# Patient Record
Sex: Female | Born: 1970 | Race: White | Hispanic: No | Marital: Single | Smoking: Never smoker
Health system: Southern US, Community
[De-identification: ages and names within clinical notes are randomized; demographics above are authoritative.]

---

## 2008-09-19 ENCOUNTER — Other Ambulatory Visit: Admission: RE | Admit: 2008-09-19 | Discharge: 2008-09-19 | Payer: Self-pay | Admitting: Family Medicine

## 2009-11-06 ENCOUNTER — Other Ambulatory Visit: Admission: RE | Admit: 2009-11-06 | Discharge: 2009-11-06 | Payer: Self-pay | Admitting: Family Medicine

## 2010-11-11 ENCOUNTER — Other Ambulatory Visit: Payer: Self-pay | Admitting: Family Medicine

## 2010-11-11 ENCOUNTER — Other Ambulatory Visit
Admission: RE | Admit: 2010-11-11 | Discharge: 2010-11-11 | Payer: Self-pay | Source: Home / Self Care | Admitting: Family Medicine

## 2012-11-15 ENCOUNTER — Other Ambulatory Visit: Payer: Self-pay | Admitting: Family Medicine

## 2012-11-15 ENCOUNTER — Other Ambulatory Visit (HOSPITAL_COMMUNITY)
Admission: RE | Admit: 2012-11-15 | Discharge: 2012-11-15 | Disposition: A | Discharge status: Home / Self Care | Payer: BC Managed Care – PPO | Source: Ambulatory Visit | Attending: Family Medicine | Admitting: Family Medicine

## 2012-11-15 DIAGNOSIS — Z1231 Encounter for screening mammogram for malignant neoplasm of breast: Secondary | ICD-10-CM

## 2012-11-15 DIAGNOSIS — Z124 Encounter for screening for malignant neoplasm of cervix: Secondary | ICD-10-CM | POA: Insufficient documentation

## 2012-12-20 ENCOUNTER — Ambulatory Visit
Admission: RE | Admit: 2012-12-20 | Discharge: 2012-12-20 | Disposition: A | Discharge status: Home / Self Care | Payer: BC Managed Care – PPO | Source: Ambulatory Visit | Attending: Family Medicine | Admitting: Family Medicine

## 2012-12-20 DIAGNOSIS — Z1231 Encounter for screening mammogram for malignant neoplasm of breast: Secondary | ICD-10-CM

## 2013-12-01 ENCOUNTER — Ambulatory Visit
Admission: RE | Admit: 2013-12-01 | Discharge: 2013-12-01 | Disposition: A | Discharge status: Home / Self Care | Payer: BC Managed Care – PPO | Source: Ambulatory Visit | Attending: Family Medicine | Admitting: Family Medicine

## 2013-12-01 ENCOUNTER — Other Ambulatory Visit: Payer: Self-pay | Admitting: Family Medicine

## 2013-12-01 DIAGNOSIS — M25532 Pain in left wrist: Secondary | ICD-10-CM

## 2014-02-13 ENCOUNTER — Other Ambulatory Visit: Payer: Self-pay | Admitting: Orthopedic Surgery

## 2014-02-13 DIAGNOSIS — M25532 Pain in left wrist: Secondary | ICD-10-CM

## 2014-02-23 ENCOUNTER — Ambulatory Visit
Admission: RE | Admit: 2014-02-23 | Discharge: 2014-02-23 | Disposition: A | Discharge status: Home / Self Care | Payer: BC Managed Care – PPO | Source: Ambulatory Visit | Attending: Orthopedic Surgery | Admitting: Orthopedic Surgery

## 2014-02-23 DIAGNOSIS — M25532 Pain in left wrist: Secondary | ICD-10-CM

## 2014-02-23 MED ORDER — IOHEXOL 180 MG/ML  SOLN
3.0000 mL | Freq: Once | INTRAMUSCULAR | Status: AC | PRN
  Administered 2014-02-23: 3 mL via INTRA_ARTICULAR

## 2016-06-25 ENCOUNTER — Other Ambulatory Visit: Payer: Self-pay | Admitting: Family Medicine

## 2016-06-25 ENCOUNTER — Other Ambulatory Visit (HOSPITAL_COMMUNITY)
Admission: RE | Admit: 2016-06-25 | Discharge: 2016-06-25 | Disposition: A | Discharge status: Home / Self Care | Payer: BC Managed Care – PPO | Source: Ambulatory Visit | Attending: Family Medicine | Admitting: Family Medicine

## 2016-06-25 DIAGNOSIS — Z01419 Encounter for gynecological examination (general) (routine) without abnormal findings: Secondary | ICD-10-CM | POA: Diagnosis not present

## 2016-06-27 LAB — CYTOLOGY - PAP

## 2018-02-08 ENCOUNTER — Other Ambulatory Visit: Payer: Self-pay | Admitting: Family Medicine

## 2018-02-08 DIAGNOSIS — Z1231 Encounter for screening mammogram for malignant neoplasm of breast: Secondary | ICD-10-CM

## 2018-02-16 ENCOUNTER — Ambulatory Visit
Admission: RE | Admit: 2018-02-16 | Discharge: 2018-02-16 | Disposition: A | Discharge status: Home / Self Care | Payer: BC Managed Care – PPO | Source: Ambulatory Visit | Attending: Family Medicine | Admitting: Family Medicine

## 2018-02-16 DIAGNOSIS — Z1231 Encounter for screening mammogram for malignant neoplasm of breast: Secondary | ICD-10-CM

## 2018-07-30 ENCOUNTER — Ambulatory Visit: Payer: BC Managed Care – PPO | Admitting: Psychiatry

## 2018-07-30 DIAGNOSIS — F4323 Adjustment disorder with mixed anxiety and depressed mood: Secondary | ICD-10-CM

## 2018-07-30 NOTE — Progress Notes (Signed)
      [facility] Counselor/Therapist Progress Note   Patient ID: Tammy [name], MRN: [id number]  Date: 07/30/2018  Timespent: 45 minutes  Treatment Type: Individual  Subjective: Client comes in today stressed due to her work environment at the [employer].  She realizes that people at her work have very similar behaviors to what she grew up with as a child.  She realizes that is why she is as reactive as she is.  The client has recently acquired a grant from [employer] [place] for a humanities corridor in [place].  This grant gives faculty members research money.  She continues to get emails from faculty who are upset because the IRS has taken a chunk of the research money for taxes.  She also has realized how the higher ups in the [place] are missed matched in their emotions and what they actually mean.  She finds this is very disingenuous but helpful now that she sees it.  Discussed that she Tammy [name] want to read Tammy [name] new book, "talking with strangers".  The client sees that she rushes from activity to activity.  Discussed trying to be more intentional about which activities she would take on and those that she would not.  Interventions:Assertiveness Training, Psychosocial Skills: Boundaries, Supportive and Other: EMDR  Mental Status Exam:   Appearance:   Well Groomed     Behavior:  Appropriate  Motor:  Normal  Speech/Language:   Clear and Coherent  Affect:  Appropriate  Mood:  constricted and sad  Thought process:  Coherent  Thought content:    Logical  Perceptual disturbances:    Normal  Orientation:  Full (Time, Place, and Person)  Attention:  Good  Concentration:  good  Memory:  Immediate  Fund of knowledge:   Good  Insight:    Good  Judgment:   Good  Impulse Control:  good    Reported Symptoms: Sad, anxious, frustrated  Risk Assessment: Danger to Self:  No Self-injurious Behavior: No Danger to Others: No Duty to Warn:no Physical Aggression /  Violence:No  Access to Firearms a concern: No  Gang Involvement:No   Diagnosis:   ICD-10-CM   1. Adjustment disorder with mixed anxiety and depressed mood F43.23      Plan: Read, Talking with Strangers, boundaries, assertiveness  [place], [place]

## 2018-09-15 ENCOUNTER — Ambulatory Visit: Payer: BC Managed Care – PPO | Admitting: Psychiatry

## 2018-11-05 ENCOUNTER — Ambulatory Visit: Payer: BC Managed Care – PPO | Admitting: Psychiatry

## 2018-11-05 ENCOUNTER — Encounter: Payer: Self-pay | Admitting: Psychiatry

## 2018-11-05 DIAGNOSIS — F4323 Adjustment disorder with mixed anxiety and depressed mood: Secondary | ICD-10-CM
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
## 2018-11-12 ENCOUNTER — Encounter: Payer: Self-pay | Admitting: Psychiatry

## 2018-11-12 ENCOUNTER — Ambulatory Visit: Payer: BC Managed Care – PPO | Admitting: Psychiatry

## 2018-11-12 DIAGNOSIS — F4323 Adjustment disorder with mixed anxiety and depressed mood: Secondary | ICD-10-CM | POA: Diagnosis not present

## 2018-11-12 NOTE — Progress Notes (Signed)
      [facility] Counselor/Therapist Progress Note  Patient ID: Tammy [name], MRN: [id number],    Date: 11/12/2018  Time Spent: 57 minutes   Treatment Type: Individual Therapy  Reported Symptoms: Anxious Mood  Mental Status Exam:  Appearance:   Well Groomed     Behavior:  Appropriate  Motor:  Normal  Speech/Language:   Clear and Coherent  Affect:  Appropriate  Mood:  anxious  Thought process:  normal  Thought content:    WNL  Sensory/Perceptual disturbances:    WNL  Orientation:  oriented to person, place, time/date and situation  Attention:  Good  Concentration:  Good  Memory:  WNL  Fund of knowledge:   Good  Insight:    Good  Judgment:   Good  Impulse Control:  Good   Risk Assessment: Danger to Self:  No Self-injurious Behavior: No Danger to Others: No Duty to Warn:no Physical Aggression / Violence:No  Access to Firearms a concern: No  Gang Involvement:No   Subjective: The client has gained good insight into boundaries with her for friends since last session.  "I am more observing and less reactionary."  Client has found that she has been able to set boundaries more naturally with friends.  This is been a huge boon for the client. She notes that she has been able to look at her department differently more in the big picture versus in a granular way.  This is been a positive mood for the client.  She also finds that she has been interacting with her family do differently.  She realizes that her mother has set up a codependent dysfunctional system in the family.  2 of her siblings are very self absorbed and not "enlightened."  She has 1 brother who seems to be decompensating in his social life.  She sees that he will not listen to her direction.  She has decided that she will listen to some [employer] on codependence and then verbally discussed some of that with her mom.  She is hopeful that if she can give her the right information that it will have a  trickle down affect to the rest of the family.  Interventions: Assertiveness/Communication, Solution-Oriented/Positive Psychology and Insight-Oriented  Diagnosis:   ICD-10-CM   1. Adjustment disorder with mixed anxiety and depressed mood F43.23     Plan: YouTube videos on codependence, conversations with mother, boundaries, assertiveness.  Gelene Mink Natesha Hassey, [place]

## 2018-11-17 ENCOUNTER — Ambulatory Visit: Payer: BC Managed Care – PPO | Admitting: Psychiatry

## 2018-12-01 ENCOUNTER — Ambulatory Visit: Payer: BC Managed Care – PPO | Admitting: Psychiatry

## 2018-12-01 ENCOUNTER — Encounter: Payer: Self-pay | Admitting: Psychiatry

## 2018-12-01 DIAGNOSIS — F4323 Adjustment disorder with mixed anxiety and depressed mood: Secondary | ICD-10-CM

## 2018-12-01 NOTE — Progress Notes (Signed)
      [facility] Counselor/Therapist Progress Note  Patient ID: Tammy [name], MRN: [id number],    Date: 12/01/2018  Time Spent: 57 minutes   Treatment Type: Individual Therapy  Reported Symptoms: Depressed mood and Anxious Mood  Mental Status Exam:  Appearance:   Well Groomed     Behavior:  Appropriate  Motor:  Normal  Speech/Language:   Clear and Coherent  Affect:  Appropriate  Mood:  anxious and sad  Thought process:  normal  Thought content:    WNL  Sensory/Perceptual disturbances:    WNL  Orientation:  oriented to person, place, time/date and situation  Attention:  Good  Concentration:  Good  Memory:  WNL  Fund of knowledge:   Good  Insight:    Good  Judgment:   Good  Impulse Control:  Good   Risk Assessment: Danger to Self:  No Self-injurious Behavior: No Danger to Others: No Duty to Warn:no Physical Aggression / Violence:No  Access to Firearms a concern: No  Gang Involvement:No   Subjective: The client did listen to some [employer] Manson Passey on YouTube but decided not to discuss any of this with her family.  She did not feel like it would be a productive conversation.  The client has noticed that she has more separation from other people and is able to accept their dysfunctional behavior without taking it on herself. The client was more upset with her family at the lack of reciprocity that she feels from them.  Her siblings seem to be fairly self absorbed.  All of them are married and since she is not they expect her to be at their back and call when she is in [place].  The client finds this very troubling.  I used eye movement with the client to help decrease her level of disturbance from a subjective units of distress of 5+ to less than 2 at the end of the session.  She continues to set boundaries and be assertive and clear in her communication.  She is also been better at not engaging in codependent behavior such as over responsibility.  She will actively work at cultivating  new relationships that are more reciprocal than her current ones.  Interventions: Assertiveness/Communication, Solution-Oriented/Positive Psychology, Eye Movement Desensitization and Reprocessing (EMDR) and Insight-Oriented  Diagnosis:   ICD-10-CM   1. Adjustment disorder with mixed anxiety and depressed mood F43.23     Plan: Cultivate new relationships, assertiveness, boundaries.  Tammy [name], [place]

## 2018-12-15 ENCOUNTER — Ambulatory Visit: Payer: BC Managed Care – PPO | Admitting: Psychiatry

## 2018-12-15 ENCOUNTER — Encounter: Payer: Self-pay | Admitting: Psychiatry

## 2018-12-15 DIAGNOSIS — F4323 Adjustment disorder with mixed anxiety and depressed mood: Secondary | ICD-10-CM | POA: Diagnosis not present
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
## 2018-12-29 ENCOUNTER — Ambulatory Visit: Payer: BC Managed Care – PPO | Admitting: Psychiatry

## 2018-12-29 ENCOUNTER — Encounter: Payer: Self-pay | Admitting: Psychiatry

## 2018-12-29 DIAGNOSIS — F4323 Adjustment disorder with mixed anxiety and depressed mood: Secondary | ICD-10-CM | POA: Diagnosis not present

## 2018-12-29 NOTE — Progress Notes (Signed)
      [facility] Counselor/Therapist Progress Note  Patient ID: Tammy [name], MRN: [id number],    Date: 12/29/2018  Time Spent: 57 minutes   Treatment Type: Individual Therapy  Reported Symptoms: anxious, sad.  Mental Status Exam:  Appearance:   Well Groomed     Behavior:  Appropriate  Motor:  Normal  Speech/Language:   Clear and Coherent  Affect:  Full Range  Mood:  anxious and sad  Thought process:  normal  Thought content:    WNL  Sensory/Perceptual disturbances:    WNL  Orientation:  oriented to person, place, time/date and situation  Attention:  Good  Concentration:  Good  Memory:  WNL  Fund of knowledge:   Good  Insight:    Good  Judgment:   Good  Impulse Control:  Good   Risk Assessment: Danger to Self:  No Self-injurious Behavior: No Danger to Others: No Duty to Warn:no Physical Aggression / Violence:No  Access to Firearms a concern: No  Gang Involvement:No   Subjective: Client states that she was granted [place] citizenship yesterday.  Some of her family were very supportive others not so much.  Her friends did come in celebrated with her.  She was sad when she told her younger sister who seemed that she could not be bothered. As the client discussed this she realized that she was redefining what a good family is, what a good sister is, and what a good sister relationship should be.  She notes that something has changed in her based on some interaction with her older brother.  Her older brother would talk with her on the phone screaming at the top of her lungs yelling at her not for anything that she is done before what is going on in his life.  Her mother on the other hand is always worried about what will people think.  The part that has changed for her as she has been able to develop more space between herself and her family in a healthy way.  She has also come to a better acceptance that her "family is who they are".  She has decreased her expectations of  people in her department at the [employer].  This has led to more peace for the client and the dizziness of her life.  She is cultivating more relationships that involve genuine reciprocity.  She saw that happening when she was celebrating her day of citizenship.  Local friends took the time off of work to be with her, travel to [place] and celebrate her citizenship. Client is developing a much clearer boundary with where she hands and where others began especially with her family of origin.  These are huge steps forward for the client.  Interventions: Assertiveness/Communication, Solution-Oriented/Positive Psychology, Eye Movement Desensitization and Reprocessing (EMDR) and Insight-Oriented  Diagnosis:   ICD-10-CM   1. Adjustment disorder with mixed anxiety and depressed mood F43.23     Plan: Boundaries, assertiveness, self-care, positive self talk, exercise.  This record has been created using [employer].  Chart creation errors have been sought, but Tammy [name] not always have been located and corrected. Such creation errors do not reflect on the standard of medical care.   Tammy [name], [place]

## 2019-01-12 ENCOUNTER — Encounter: Payer: Self-pay | Admitting: Psychiatry

## 2019-01-12 ENCOUNTER — Other Ambulatory Visit: Payer: Self-pay

## 2019-01-12 ENCOUNTER — Ambulatory Visit: Payer: BC Managed Care – PPO | Admitting: Psychiatry

## 2019-01-12 DIAGNOSIS — F4323 Adjustment disorder with mixed anxiety and depressed mood: Secondary | ICD-10-CM

## 2019-01-12 NOTE — Progress Notes (Signed)
      [facility] Counselor/Therapist Progress Note  Patient ID: Tammy [name], MRN: [id number],    Date: 01/12/2019  Time Spent: 56 minutes   Treatment Type: Individual Therapy  Reported Symptoms: anxiety, frustration  Mental Status Exam:  Appearance:   Well Groomed     Behavior:  Appropriate  Motor:  Normal  Speech/Language:   Clear and Coherent  Affect:  Appropriate  Mood:  anxious  Thought process:  normal  Thought content:    WNL  Sensory/Perceptual disturbances:    WNL  Orientation:  oriented to person, place, time/date and situation  Attention:  Good  Concentration:  Good  Memory:  WNL  Fund of knowledge:   Good  Insight:    Good  Judgment:   Good  Impulse Control:  Good   Risk Assessment: Danger to Self:  No Self-injurious Behavior: No Danger to Others: No Duty to Warn:no Physical Aggression / Violence:No  Access to Firearms a concern: No  Gang Involvement:No   Subjective: "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  She had also completed some major books although her [place] volume has yet to be completed.  She had approached the chair of her department asking her if she had her support to go for full professor.  This is a relationship that the client has found particularly difficult.  The interactions are full of passive aggressive behavior, thinking errors and overall difficulty.  Her chair would not give her a direct answer in the 3 emails she sent.  She was able to be very directed to the point and person and was finally able to get confirmation in writing that the lead tenured professor and the chair are both on board.  The client dialogue at length about her insight with her chair and how her behavior relates to some of her family dynamics.  The client realized that she is always let her locus of control the outside of herself  which then resulted in others defining her.  She has made great progress in being able to excepting distance herself from others behavior and not having an impact on who she thinks she gets.  The client admits that it has all been very arduous but worthwhile at the same time.  She will continue to be assertive and set boundaries making sure that she addresses her negative cognitions as they come up.  Interventions: Assertiveness/Communication, Solution-Oriented/Positive Psychology, Eye Movement Desensitization and Reprocessing (EMDR) and Insight-Oriented  Diagnosis:   ICD-10-CM   1. Adjustment disorder with mixed anxiety and depressed mood F43.23     Plan: Boundaries, assertiveness, self-care, positive self talk, exercise.  This record has been created using [employer].  Chart creation errors have been sought, but [name] not always have been located and corrected. Such creation errors do not reflect on the standard of medical care.   Khallid Pasillas, [place]

## 2019-01-28 ENCOUNTER — Other Ambulatory Visit: Payer: Self-pay

## 2019-01-28 ENCOUNTER — Ambulatory Visit (INDEPENDENT_AMBULATORY_CARE_PROVIDER_SITE_OTHER): Payer: BC Managed Care – PPO | Admitting: Psychiatry

## 2019-01-28 ENCOUNTER — Encounter: Payer: Self-pay | Admitting: Psychiatry

## 2019-01-28 DIAGNOSIS — F4323 Adjustment disorder with mixed anxiety and depressed mood: Secondary | ICD-10-CM
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
## 2019-02-11 ENCOUNTER — Other Ambulatory Visit: Payer: Self-pay

## 2019-02-11 ENCOUNTER — Encounter: Payer: Self-pay | Admitting: Psychiatry

## 2019-02-11 ENCOUNTER — Ambulatory Visit (INDEPENDENT_AMBULATORY_CARE_PROVIDER_SITE_OTHER): Payer: BC Managed Care – PPO | Admitting: Psychiatry

## 2019-02-11 DIAGNOSIS — F4323 Adjustment disorder with mixed anxiety and depressed mood: Secondary | ICD-10-CM

## 2019-02-11 NOTE — Progress Notes (Signed)
[facility] Counselor/Therapist Progress Note  Patient ID: Tammy [name], MRN: [id number],    Date: 02/11/2019  Time Spent: 58 minutes   Treatment Type: Individual Therapy  Reported Symptoms: frustration, sadness, anxiety.  Mental Status Exam:  Appearance:   Casual     Behavior:  Appropriate  Motor:  Normal  Speech/Language:   Clear and Coherent  Affect:  Appropriate  Mood:  anxious and sad  Thought process:  normal  Thought content:    WNL  Sensory/Perceptual disturbances:    WNL  Orientation:  oriented to person, place, time/date and situation  Attention:  Good  Concentration:  Good  Memory:  WNL  Fund of knowledge:   Good  Insight:    Good  Judgment:   Good  Impulse Control:  Good   Risk Assessment: Danger to Self:  No Self-injurious Behavior: No Danger to Others: No Duty to Warn:no Physical Aggression / Violence:No  Access to Firearms a concern: No  Gang Involvement:No   Subjective: I connected with patient by a video enabled telemedicine application or telephone, with their informed consent, and verified patient privacy and that I am speaking with the correct person using two identifiers.  I was located at [facility] psychiatric group and patient at home. The client discussed the event with other researchers for the [employer] that they got for humanities corridor in [place].  They had to do the whole event via WebEx which was difficult but the client was able to pull it off.  Apparently there is a Teacher, adult education in the [place] office.  The client feels that this woman is, "prickly".  She found out that the main fundraiser for [employer] also dislikes her intensely.  Client realizes that there is only so much she can do and she cannot obsess over whether things go correctly or not.  "I am not the person in charge."  This is a big change for the client since her tendency is to be overly responsible for everything. The client has learned that she has to be much  more flexible since the sheltering place order.  She was trying to fit herself into a certain schedule that "she thinks everybody else does."  She realizes this is not how she operates and has moved towards trying to be more flexible with herself.  Her need for social interaction is much greater than most.  She has been able to accomplish that through video chats and telephone calls.  She does which she can with her work and she finds herself not worrying about it as much. 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  She is very frustrated with her chair because she has not given her the dates of what is necessary to accomplish by when.  Her chair states that she is, "too needy."  In actuality the client has not been given the information that she needs.  So yes, she is "needy" but it is appropriate because she is not given what she is needed.  This has been good for the client to differentiate this is well.  She has been more skillful in her assertiveness and setting boundaries.  Interventions: Assertiveness/Communication, Solution-Oriented/Positive Psychology and Insight-Oriented  Diagnosis:   ICD-10-CM   1. Adjustment disorder with mixed anxiety and depressed mood F43.23     Plan: Assertiveness, boundaries, social network, exercise.  This record has been created using [employer].  Chart creation errors have been sought, but Tammy [name] not  always have been located and corrected. Such creation errors do not reflect on the standard of medical care.  Tammy [name], [facility]

## 2019-02-24 ENCOUNTER — Ambulatory Visit: Payer: BC Managed Care – PPO | Admitting: Psychiatry

## 2019-02-25 ENCOUNTER — Other Ambulatory Visit: Payer: Self-pay

## 2019-02-25 ENCOUNTER — Encounter: Payer: Self-pay | Admitting: Psychiatry

## 2019-02-25 ENCOUNTER — Ambulatory Visit (INDEPENDENT_AMBULATORY_CARE_PROVIDER_SITE_OTHER): Payer: BC Managed Care – PPO | Admitting: Psychiatry

## 2019-02-25 DIAGNOSIS — F4323 Adjustment disorder with mixed anxiety and depressed mood: Secondary | ICD-10-CM

## 2019-02-25 NOTE — Progress Notes (Signed)
[facility] Counselor/Therapist Progress Note  Patient ID: Tammy [name], MRN: [id number],    Date: 02/25/2019  Time Spent: 58 minutes   Treatment Type: Individual Therapy  Reported Symptoms: sadness, frustration, irritability, anxiety  Mental Status Exam:  Appearance:   Casual and Well Groomed     Behavior:  Appropriate  Motor:  Normal  Speech/Language:   Clear and Coherent  Affect:  Appropriate  Mood:  anxious, irritable and sad  Thought process:  normal  Thought content:    WNL  Sensory/Perceptual disturbances:    WNL  Orientation:  oriented to person, place, time/date and situation  Attention:  Good  Concentration:  Good  Memory:  WNL  Fund of knowledge:   Good  Insight:    Good  Judgment:   Good  Impulse Control:  Good   Risk Assessment: Danger to Self:  No Self-injurious Behavior: No Danger to Others: No Duty to Warn:no Physical Aggression / Violence:No  Access to Firearms a concern: No  Gang Involvement:No   Subjective: Telehealth visit I connected with patient by a video enabled telemedicine/telehealth application or telephone, with her informed consent, and verified patient privacy and that I am speaking with the correct person using two identifiers.  I was located at my office and patient at her home.  We discussed the limitations, risks, and security and privacy concerns associated with telehealth services and the availability of in-person appointments, including awareness that she Tammy [name] be responsible for charges related to the service, and she expressed understanding and agreed to proceed.  I discussed treatment planning with her, with opportunity to ask and answer all questions. Agreed with the plan, demonstrated an understanding of the instructions, and made her aware to call our office if symptoms worsen or she feels she is in a crisis state and needs immediate contact.  The client continues to have issues with her chair of the department.  The client is  upset because so many people either in her department or in the office of the Chancellor treat her as if she is untrustworthy.  This upsets the client quite a bit because she is quite trustworthy.  We discussed today the necessity of not letting others define her but taking back her locus of control from them.  She is so disappointed in her friend that is the chair of their department because of how poorly she is treated by her.  She works hard at being appropriate but this other person has no problem excoriating her.  As we continue to discuss this she stated "I am learning how to define myself without others impacting me."  The sheltering in place has been helpful in that it has removed her out of the work environment and given her more control over the daily mess of her life.  It has put things into a better perspective for her. The client wants to learn how to respond back people that are inappropriately harsh with her.  We will discuss that at next session.  I will go back over the anger clarification model  Interventions: Assertiveness/Communication, Solution-Oriented/Positive Psychology and Insight-Oriented  Diagnosis:   ICD-10-CM   1. Adjustment disorder with mixed anxiety and depressed mood F43.23     Plan: Boundaries, assertiveness, self care, positive self talk, exercise.  This record has been created using [employer].  Chart creation errors have been sought, but Tammy [name] not always have been located and corrected. Such creation errors do not reflect on the standard of medical  care.  Tammy [name], [facility]

## 2019-03-02 ENCOUNTER — Other Ambulatory Visit: Payer: Self-pay

## 2019-03-02 ENCOUNTER — Encounter: Payer: Self-pay | Admitting: Psychiatry

## 2019-03-02 ENCOUNTER — Ambulatory Visit: Payer: BC Managed Care – PPO | Admitting: Psychiatry

## 2019-03-02 DIAGNOSIS — F4323 Adjustment disorder with mixed anxiety and depressed mood: Secondary | ICD-10-CM | POA: Diagnosis not present

## 2019-03-02 NOTE — Progress Notes (Signed)
      [facility] Counselor/Therapist Progress Note  Patient ID: Tammy [name], MRN: [id number],    Date: 03/02/2019  Time Spent: 59 minutes   Treatment Type: Individual Therapy  Reported Symptoms: anxiety, sadness, stress, irritable.  Mental Status Exam:  Appearance:   Casual and Well Groomed     Behavior:  Appropriate  Motor:  Normal  Speech/Language:   Clear and Coherent  Affect:  Appropriate  Mood:  anxious, irritable and sad  Thought process:  normal  Thought content:    WNL  Sensory/Perceptual disturbances:    WNL  Orientation:  oriented to person, place, time/date and situation  Attention:  Good  Concentration:  Good  Memory:  WNL  Fund of knowledge:   Good  Insight:    Good  Judgment:   Good  Impulse Control:  Good   Risk Assessment: Danger to Self:  No Self-injurious Behavior: No Danger to Others: No Duty to Warn:no Physical Aggression / Violence:No  Access to Firearms a concern: No  Gang Involvement:No   Subjective: Telehealth visit I connected with patient by a video enabled telemedicine/telehealth application or telephone, with her informed consent, and verified patient privacy and that I am speaking with the correct person using two identifiers.  I was located at my office and patient at her home.  We discussed the limitations, risks, and security and privacy concerns associated with telehealth services and the availability of in-person appointments, including awareness that she Torrence Branagan be responsible for charges related to the service, and she expressed understanding and agreed to proceed.  I discussed treatment planning with her, with opportunity to ask and answer all questions. Agreed with the plan, demonstrated an understanding of the instructions, and made her aware to call our office if symptoms worsen or she feels she is in a crisis state and needs immediate contact.  The client is angry that her department is poorly run and none of her colleagues  acknowledge any of her awards or accomplishments.  The [employer] [place] [employer] [place] published a full page and in the newspaper with the client listed with her awards.  She is shocked that they can be so callous and uncaring.  The client acknowledges, "I have to let it go I have to have less of an investment in what they think."  She is very invested in the team concept and is grieving the loss of not having the department that she had hoped for.  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  She agrees and will begin to focus on this to move forward.  Interventions: Assertiveness/Communication, Solution-Oriented/Positive Psychology, Eye Movement Desensitization and Reprocessing (EMDR) and Insight-Oriented  Diagnosis:   ICD-10-CM   1. Adjustment disorder with mixed anxiety and depressed mood F43.23     Plan: Boundaries, assertiveness, radical acceptance.  This record has been created using [employer].  Chart creation errors have been sought, but Irena Gaydos not always have been located and corrected. Such creation errors do not reflect on the standard of medical care.  Gelene Mink Avantika Shere, [facility]

## 2019-03-03 ENCOUNTER — Encounter: Payer: Self-pay | Admitting: Psychiatry

## 2019-03-03 ENCOUNTER — Other Ambulatory Visit: Payer: Self-pay

## 2019-03-03 ENCOUNTER — Ambulatory Visit (INDEPENDENT_AMBULATORY_CARE_PROVIDER_SITE_OTHER): Payer: BC Managed Care – PPO | Admitting: Psychiatry

## 2019-03-03 DIAGNOSIS — F4323 Adjustment disorder with mixed anxiety and depressed mood: Secondary | ICD-10-CM

## 2019-03-03 NOTE — Progress Notes (Signed)
[facility] Counselor/Therapist Progress Note  Patient ID: Tammy [name], MRN: [id number],    Date: 03/03/2019  Time Spent: 59 minutes   Treatment Type: Individual Therapy  Reported Symptoms: stress, sadness, irritability, anxiety.  Mental Status Exam:  Appearance:   Casual and Well Groomed     Behavior:  Appropriate  Motor:  Normal  Speech/Language:   Clear and Coherent  Affect:  Appropriate  Mood:  anxious, irritable and sad  Thought process:  normal  Thought content:    WNL  Sensory/Perceptual disturbances:    WNL  Orientation:  oriented to person, place, time/date and situation  Attention:  Good  Concentration:  Good  Memory:  WNL  Fund of knowledge:   Good  Insight:    Good  Judgment:   Good  Impulse Control:  Good   Risk Assessment: Danger to Self:  No Self-injurious Behavior: No Danger to Others: No Duty to Warn:no Physical Aggression / Violence:No  Access to Firearms a concern: No  Gang Involvement:No   Subjective: Telehealth visit I connected with patient by a video enabled telemedicine/telehealth application or telephone, with her informed consent, and verified patient privacy and that I am speaking with the correct person using two identifiers.  I was located at my office and patient at her home.  We discussed the limitations, risks, and security and privacy concerns associated with telehealth services and the availability of in-person appointments, including awareness that she Tammy [name] be responsible for charges related to the service, and she expressed understanding and agreed to proceed.  I discussed treatment planning with her, with opportunity to ask and answer all questions. Agreed with the plan, demonstrated an understanding of the instructions, and made her aware to call our office if symptoms worsen or she feels she is in a crisis state and needs immediate contact. The client continued to discuss how important the team concept is for her.  Growing up in  [place] she participated in team sports which was an important part of their community.  Loyalty and commitment were highly prized.  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  The client did meet with the chair of the department for her evaluation.  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  Outside of her department she received numerous congratulations from other faculty members.  Inside her department she was met with silence and grudgingly acceptance. Today we used eye-movement to help processed the client's anger from the subjective units of distress of 5 to less than 2 at the end of the session.  She states that she has to slow down and realized that she cannot expect them to be loyal.  "I need to detach from the department."  The client was much calmer and feels like she is in a better position to do  Interventions: Assertiveness/Communication, Solution-Oriented/Positive Psychology, Eye Movement Desensitization and Reprocessing (EMDR) and Insight-Oriented  Diagnosis:   ICD-10-CM   1. Adjustment disorder with mixed anxiety and depressed mood F43.23     Plan: Assertiveness, boundaries, detachment, self care.  This record has been created using [employer].  Chart creation errors have been sought, but Shaunee Mulkern not always have been located and corrected. Such  creation errors do not reflect on the standard of medical care.  Vivan Vanderveer, [facility]

## 2019-03-10 ENCOUNTER — Other Ambulatory Visit: Payer: Self-pay

## 2019-03-10 ENCOUNTER — Ambulatory Visit (INDEPENDENT_AMBULATORY_CARE_PROVIDER_SITE_OTHER): Payer: BC Managed Care – PPO | Admitting: Psychiatry

## 2019-03-10 ENCOUNTER — Encounter: Payer: Self-pay | Admitting: Psychiatry

## 2019-03-10 DIAGNOSIS — F4323 Adjustment disorder with mixed anxiety and depressed mood: Secondary | ICD-10-CM | POA: Diagnosis not present

## 2019-03-10 NOTE — Progress Notes (Signed)
[facility] Counselor/Therapist Progress Note  Patient ID: Tammy [name], MRN: [id number],    Date: 03/10/2019  Time Spent: 58 minutes   Treatment Type: Individual Therapy  Reported Symptoms: sadness, frustration, fatigue, stress  Mental Status Exam:  Appearance:   Casual and Well Groomed     Behavior:  Appropriate  Motor:  Normal  Speech/Language:   Clear and Coherent  Affect:  Appropriate  Mood:  irritable, sad and stressed  Thought process:  normal  Thought content:    WNL  Sensory/Perceptual disturbances:    WNL and fatigue  Orientation:  oriented to person, place, time/date and situation  Attention:  Good  Concentration:  Good  Memory:  WNL  Fund of knowledge:   Good  Insight:    Good  Judgment:   Good  Impulse Control:  Good   Risk Assessment: Danger to Self:  No Self-injurious Behavior: No Danger to Others: No Duty to Warn:no Physical Aggression / Violence:No  Access to Firearms a concern: No  Gang Involvement:No   Subjective: Telehealth visit I connected with patient by a video enabled telemedicine/telehealth application or telephone, with her informed consent, and verified patient privacy and that I am speaking with the correct person using two identifiers.  I was located at my office and patient at her home.  We discussed the limitations, risks, and security and privacy concerns associated with telehealth services and the availability of in-person appointments, including awareness that she Tammy [name] be responsible for charges related to the service, and she expressed understanding and agreed to proceed.  I discussed treatment planning with her, with opportunity to ask and answer all questions. Agreed with the plan, demonstrated an understanding of the instructions, and made her aware to call our office if symptoms worsen or she feels she is in a crisis state and needs immediate contact.  The client states that 7 years ago she had serious chronic fatigue.  She  has since recovered from it but it is left her very sensitive to tiredness.  "I have not really done well physically since our last session."  The client describes herself as being painfully exhausted.  She also had been working out in her yard and got poison ivy on her face. "I am sick and tired of getting run all over."  As the client discussed what is been going on in her life she realized that she had become the go to person for all of her family and most of her friends.  She stated that she was always on the phone with someone and that it has "worn her out."  She talks to her mother for an hour and a half every day and her Tammy [name] for at least 1 hour. She states she needs almost 4 days of just being in bed to recover.  I discussed with the client that she needs to develop more assertive skills in being able to say no.  Saying no does not mean she is a bad person.  She reports she always hears her mother's voice in her head admonishing her about how tough everybody else's life is and she needs to be kind to them.  This results in the client burning out.  I explained to her she essentially was suffering from caregivers burnout.  This resonated with the client We discussed about actively working at giving herself respite.  If she did not her body would force her to do so.  I explained that it would be  much better for her to be proactive about it.  The client agreed she would work on this.  She will also work at being able to say no.    Interventions: Assertiveness/Communication, Solution-Oriented/Positive Psychology, Eye Movement Desensitization and Reprocessing (EMDR) and Insight-Oriented  Diagnosis:   ICD-10-CM   1. Adjustment disorder with mixed anxiety and depressed mood F43.23     Plan: Boundaries, assertiveness, self-care.  This record has been created using [employer] software.  Chart creation errors have been sought, but Tammy [name] not always have been located and corrected. Such creation errors do  not reflect on the standard of medical care.  Tammy [name], [facility]

## 2019-03-23 ENCOUNTER — Ambulatory Visit (INDEPENDENT_AMBULATORY_CARE_PROVIDER_SITE_OTHER): Payer: BC Managed Care – PPO | Admitting: Psychiatry

## 2019-03-23 ENCOUNTER — Encounter: Payer: Self-pay | Admitting: Psychiatry

## 2019-03-23 ENCOUNTER — Other Ambulatory Visit: Payer: Self-pay

## 2019-03-23 DIAGNOSIS — F4323 Adjustment disorder with mixed anxiety and depressed mood: Secondary | ICD-10-CM

## 2019-03-23 NOTE — Progress Notes (Signed)
[facility] Counselor/Therapist Progress Note  Patient ID: Tammy [name], MRN: [id number],    Date: 03/23/2019  Time Spent: 58 minutes   Treatment Type: Individual Therapy  Reported Symptoms: sad, irritable, anxious  Mental Status Exam:  Appearance:   Casual and Well Groomed     Behavior:  Appropriate  Motor:  Normal  Speech/Language:   Clear and Coherent  Affect:  Appropriate  Mood:  anxious, irritable and sad  Thought process:  normal  Thought content:    WNL  Sensory/Perceptual disturbances:    WNL  Orientation:  oriented to person, place, time/date and situation  Attention:  Good  Concentration:  Good  Memory:  WNL  Fund of knowledge:   Good  Insight:    Good  Judgment:   Good  Impulse Control:  Good   Risk Assessment: Danger to Self:  No Self-injurious Behavior: No Danger to Others: No Duty to Warn:no Physical Aggression / Violence:No  Access to Firearms a concern: No  Gang Involvement:No   Subjective: Telehealth visit I connected with patient by a video enabled telemedicine/telehealth application or telephone, with her informed consent, and verified patient privacy and that I am speaking with the correct person using two identifiers.  I was located at my office and patient at her home.  We discussed the limitations, risks, and security and privacy concerns associated with telehealth services and the availability of in-person appointments, including awareness that she Tammy [name] be responsible for charges related to the service, and she expressed understanding and agreed to proceed.  I discussed treatment planning with her, with opportunity to ask and answer all questions. Agreed with the plan, demonstrated an understanding of the instructions, and made her aware to call our office if symptoms worsen or she feels she is in a crisis state and needs immediate contact.  The client states that she has had a hard time since the end of the semester.  She attributes it to 1)  being so very tired.  2) I could not go home to [place].  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  4) the COVID-19 pandemic.  She has felt less exhausted since she has been paying attention to what her body needs.  Her main concerns are how she is being treated in her department especially by her department chair.  The client had believed that the department chair was a friend.  It is turned out to be something that is much more self serving for her friend.  She realizes that she has been gaslighted by her colleagues.  No one in her department has mentioned her going up for full professor.  She also has not gotten much direction from her chair.  She has gone outside of the [employer] to other peers in other universities who are directing and guiding her. Today we discussed being able to detach from what the people in her department believe about her.  They were critical of her curriculum vita which had some comas in the wrong places.  It had nothing to do with the content of her CV which is replete with academic papers and completed books.  The client sees that this is all part of a codependent process that she is coming to terms with.  Her over responsibility for others responses to her.  She is getting better at letting go of all this setting boundaries and being assertive.  Interventions: Assertiveness/Communication, Motivational Interviewing, Solution-Oriented/Positive Psychology and Insight-Oriented  Diagnosis:   ICD-10-CM  1. Adjustment disorder with mixed anxiety and depressed mood F43.23     Plan: Assertiveness, boundaries, reach out to her social network, self-care.  This record has been created using [employer] software.  Chart creation errors have been sought, but Tammy [name] not always have been located and corrected. Such creation errors do not reflect on the standard of medical care.  Tammy [name], [facility]

## 2019-03-30 ENCOUNTER — Encounter: Payer: Self-pay | Admitting: Psychiatry

## 2019-03-30 ENCOUNTER — Other Ambulatory Visit: Payer: Self-pay

## 2019-03-30 ENCOUNTER — Ambulatory Visit (INDEPENDENT_AMBULATORY_CARE_PROVIDER_SITE_OTHER): Payer: BC Managed Care – PPO | Admitting: Psychiatry

## 2019-03-30 DIAGNOSIS — F4323 Adjustment disorder with mixed anxiety and depressed mood: Secondary | ICD-10-CM

## 2019-03-30 NOTE — Progress Notes (Signed)
[facility] Counselor/Therapist Progress Note  Patient ID: Tammy BailiffJoanne Hofstra, MRN: [id number],    Date: 03/30/2019  Time Spent: 57 minutes   Treatment Type: Individual Therapy  Reported Symptoms: irritability, anxiety  Mental Status Exam:  Appearance:   Casual and Well Groomed     Behavior:  Appropriate  Motor:  Normal  Speech/Language:   Clear and Coherent  Affect:  Appropriate  Mood:  anxious and irritable  Thought process:  normal  Thought content:    WNL  Sensory/Perceptual disturbances:    WNL  Orientation:  oriented to person, place, time/date and situation  Attention:  Good  Concentration:  Good  Memory:  WNL  Fund of knowledge:   Good  Insight:    Good  Judgment:   Good  Impulse Control:  Good   Risk Assessment: Danger to Self:  No Self-injurious Behavior: No Danger to Others: No Duty to Warn:no Physical Aggression / Violence:No  Access to Firearms a concern: No  Gang Involvement:No   Subjective: Telehealth visit (video) I connected with patient by a video enabled telemedicine/telehealth application or telephone, with her informed consent, and verified patient privacy and that I am speaking with the correct person using two identifiers.  I was located at my office and patient at her home.  We discussed the limitations, risks, and security and privacy concerns associated with telehealth services and the availability of in-person appointments, including awareness that she Yasmine Kilbourne be responsible for charges related to the service, and she expressed understanding and agreed to proceed.  I discussed treatment planning with her, with opportunity to ask and answer all questions. Agreed with the plan, demonstrated an understanding of the instructions, and made her aware to call our office if symptoms worsen or she feels she is in a crisis state and needs immediate contact.;   The client reports that she is physically much better.  She also states that she has become mentally  better.  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  "I decided to get behind the idea that my chair is a loser." The client realizes that she needs too much validation from others.  I used eye-movement with the client around this statement.  The client immediately went to to the fact that it was because of what her mother has trained her to do.  To be a good "Ephriam KnucklesChristian" she has to accept everybody else's negative behavior.  If she pushes back then she is being "judgmental".  We discussed at length about appropriate boundaries within a [place] context.  To set boundaries for oneself is actually a stewardship responsibility.  If she cares for herself she is honoring God for the gift he was given her.  The client agrees.  Her mother's teaching about boundaries has actually kept the client from being able to protect herself.  She has come to the understanding that what ever responses she gets from people in her department that is negative most likely has nothing to do with her (radical acceptance).  She is learning to detach and try to be as skillful as she can.  She is no longer invested in everybody liking her.  She realized that not being able to protect herself was foolishness.  I encouraged the client to read through the book of Proverbs in the old testament.  It is full of wisdom about relationships such as the ones  we are talking about.  The client will review.  She will also continue with assertive communication and setting firmer clear boundaries.  Interventions: Assertiveness/Communication, Mindfulness Meditation, Motivational Interviewing, Solution-Oriented/Positive Psychology, [employer] Desensitization and Reprocessing (EMDR) and Insight-Oriented  Diagnosis:   ICD-10-CM   1. Adjustment disorder with mixed anxiety and depressed mood F43.23     Plan: Boundaries, self care, assertiveness, radical  acceptance, positive self talk.  This record has been created using [employer].  Chart creation errors have been sought, but Eliabeth Shoff not always have been located and corrected. Such creation errors do not reflect on the standard of medical care.  Gelene Mink Tysheka Fanguy, [facility]

## 2019-04-13 ENCOUNTER — Encounter: Payer: Self-pay | Admitting: Psychiatry

## 2019-04-13 ENCOUNTER — Other Ambulatory Visit: Payer: Self-pay

## 2019-04-13 ENCOUNTER — Ambulatory Visit (INDEPENDENT_AMBULATORY_CARE_PROVIDER_SITE_OTHER): Payer: BC Managed Care – PPO | Admitting: Psychiatry

## 2019-04-13 DIAGNOSIS — F4323 Adjustment disorder with mixed anxiety and depressed mood: Secondary | ICD-10-CM | POA: Diagnosis not present

## 2019-04-13 NOTE — Progress Notes (Signed)
[facility] Counselor/Therapist Progress Note  Patient ID: Tammy [name], MRN: [id number],    Date: 04/13/2019  Time Spent: 58 minutes   Treatment Type: Individual Therapy  Reported Symptoms: anxious, irritable.  Mental Status Exam:  Appearance:   Casual and Well Groomed     Behavior:  Appropriate  Motor:  Normal  Speech/Language:   Clear and Coherent  Affect:  Appropriate  Mood:  anxious and irritable  Thought process:  normal  Thought content:    WNL  Sensory/Perceptual disturbances:    WNL  Orientation:  oriented to person, place, time/date and situation  Attention:  Good  Concentration:  Good  Memory:  WNL  Fund of knowledge:   Good  Insight:    Good  Judgment:   Good  Impulse Control:  Good   Risk Assessment: Danger to Self:  No Self-injurious Behavior: No Danger to Others: No Duty to Warn:no Physical Aggression / Violence:No  Access to Firearms a concern: No  Gang Involvement:No   Subjective: Telehealth visit (video) I connected with patient by a video enabled telemedicine/telehealth application or telephone, with her informed consent, and verified patient privacy and that I am speaking with the correct person using two identifiers.  I was located at my office and patient at her vacation.  We discussed the limitations, risks, and security and privacy concerns associated with telehealth services and the availability of in-person appointments, including awareness that she Tammy [name] be responsible for charges related to the service, and she expressed understanding and agreed to proceed.  I discussed treatment planning with her, with opportunity to ask and answer all questions. Agreed with the plan, demonstrated an understanding of the instructions, and made her aware to call our office if symptoms worsen or she feels she is in a crisis state and needs immediate contact.  The client states that she had been vacationing at the beach.  "I was coming back to [place] and  I was getting stressed."  As the client talked through it she realized that she had agreed to walk with the chair of her department.  She did not want to walk with her because she finds it emotionally exhausting.  It is one  of the things that she learned from her mother, to take care of other people.  Then the client decided that she would walk with her for short time for herself.  It as a way of her not feeling powerless which is how she usually feels. As the client discussed the insights that she has gained, so much of the codependent behavior she learned from her mother had affected her relationships with her peers.  She has been able to see who the people are in her life that she has a reciprocal relationship with and which ones exhaust her.  Rather than uniformly engaging everyone she now is picking and choosing who she puts energy into.  This has been a tremendous amount of freedom for the client.  Making the connection between her mother's behavior and how she has treated people has been a huge relief for her.  She will continue to make more assertive decisions and set safe boundaries.  Interventions: Assertiveness/Communication, Motivational Interviewing, Solution-Oriented/Positive Psychology, [employer] Desensitization and Reprocessing (EMDR) and Insight-Oriented  Diagnosis:   ICD-10-CM   1. Adjustment disorder with mixed anxiety and depressed mood  F43.23     Plan: Assertiveness, boundaries, self-care.  This record has been created using [employer].  Chart creation errors have been  sought, but Tammy [name] not always have been located and corrected. Such creation errors do not reflect on the standard of medical care.  Tammy [name] Tammy [name], [facility]

## 2019-04-27 ENCOUNTER — Other Ambulatory Visit: Payer: Self-pay

## 2019-04-27 ENCOUNTER — Encounter: Payer: Self-pay | Admitting: Psychiatry

## 2019-04-27 ENCOUNTER — Ambulatory Visit (INDEPENDENT_AMBULATORY_CARE_PROVIDER_SITE_OTHER): Payer: BC Managed Care – PPO | Admitting: Psychiatry

## 2019-04-27 DIAGNOSIS — F4323 Adjustment disorder with mixed anxiety and depressed mood: Secondary | ICD-10-CM

## 2019-04-27 NOTE — Progress Notes (Signed)
      [facility] Counselor/Therapist Progress Note  Patient ID: Tammy [name], MRN: [id number],    Date: 04/27/2019  Time Spent: 58 minutes   Treatment Type: Individual Therapy  Reported Symptoms: anxiety, irritability, sadness.  Mental Status Exam:  Appearance:   Casual     Behavior:  Appropriate  Motor:  Normal  Speech/Language:   Clear and Coherent  Affect:  Appropriate  Mood:  anxious, irritable and sad  Thought process:  normal  Thought content:    WNL  Sensory/Perceptual disturbances:    WNL  Orientation:  oriented to person, place, time/date and situation  Attention:  Good  Concentration:  Good  Memory:  WNL  Fund of knowledge:   Good  Insight:    Good  Judgment:   Good  Impulse Control:  Good   Risk Assessment: Danger to Self:  No Self-injurious Behavior: No Danger to Others: No Duty to Warn:no Physical Aggression / Violence:No  Access to Firearms a concern: No  Gang Involvement:No   Subjective: Telehealth visit -- I connected with this patient by an approved telecommunication method (video), with her informed consent, and verifying identity and patient privacy.  I was located at my office and patient at her home.  As needed, we discussed the limitations, risks, and security and privacy concerns associated with telehealth service, including the availability and conditions which currently govern in-person appointments and the possibility that 3rd-party payment Tammy [name] not be fully guaranteed and she Tammy [name] be responsible for charges.  After she indicated understanding, we proceeded with the session.  Also discussed treatment planning, as needed, including ongoing verbal agreement with the plan, the opportunity to ask and answer all questions, her demonstrated understanding of instructions, and her readiness to call the office should symptoms worsen or she feels she is in a crisis state and needs more immediate and tangible assistance. The client reports that she is still in  contact with the chair of her department.  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  She is already turned in all the required paperwork.  The client has gained good insight into the dynamics with this relationship.  She realizes that her colleague is very self absorbed and does not listen to her.  She had turned her papers in last week and just heard from her Monday.  This is an important process for the client but she understood that her colleague was writing her paper and could not be bothered to complete what she needed.  She is decided that she needs to let go of any hope of change with this person.  "I need to call out bad behavior and not feel guilty."  The client also realizes that she cannot do this with her chair right now so as not to threaten her full professorship.  We discussed practicing more mood independent behavior and using radical acceptance.  She agrees.  Interventions: Assertiveness/Communication, Mindfulness Meditation, Motivational Interviewing, Solution-Oriented/Positive Psychology, [employer] Desensitization and Reprocessing (EMDR) and Insight-Oriented  Diagnosis:No diagnosis found.  Plan: Mood independent behavior, radical acceptance, assertiveness, self-care, exercise.  This record has been created using [employer].  Chart creation errors have been sought, but Tammy [name] not always have been located and corrected. Such creation errors do not reflect on the standard of medical care.  Tammy [name], [facility]

## 2019-05-11 ENCOUNTER — Encounter: Payer: Self-pay | Admitting: Psychiatry

## 2019-05-11 ENCOUNTER — Ambulatory Visit (INDEPENDENT_AMBULATORY_CARE_PROVIDER_SITE_OTHER): Payer: BC Managed Care – PPO | Admitting: Psychiatry

## 2019-05-11 ENCOUNTER — Other Ambulatory Visit: Payer: Self-pay

## 2019-05-11 DIAGNOSIS — F4323 Adjustment disorder with mixed anxiety and depressed mood: Secondary | ICD-10-CM

## 2019-05-11 NOTE — Progress Notes (Signed)
      [facility] Counselor/Therapist Progress Note  Patient ID: Tammy [name], MRN: [id number],    Date: 05/11/2019  Time Spent: 58 minutes   Treatment Type: Individual Therapy  Reported Symptoms: sad and anxious.  Mental Status Exam:  Appearance:   Casual     Behavior:  Appropriate  Motor:  Normal  Speech/Language:   Clear and Coherent  Affect:  Appropriate  Mood:  anxious and depressed  Thought process:  normal  Thought content:    WNL  Sensory/Perceptual disturbances:    WNL  Orientation:  oriented to person, place, time/date and situation  Attention:  Good  Concentration:  Good  Memory:  WNL  Fund of knowledge:   Good  Insight:    Good  Judgment:   Good  Impulse Control:  Good   Risk Assessment: Danger to Self:  No Self-injurious Behavior: No Danger to Others: No Duty to Warn:no Physical Aggression / Violence:No  Access to Firearms a concern: No  Gang Involvement:No   Subjective: Telehealth visit -- I connected with this patient by an approved telecommunication method (video), with her informed consent, and verifying identity and patient privacy.  I was located at my office and patient at her home.  As needed, we discussed the limitations, risks, and security and privacy concerns associated with telehealth service, including the availability and conditions which currently govern in-person appointments and the possibility that 3rd-party payment Nyjai Graff not be fully guaranteed and she Khrystian Schauf be responsible for charges.  After she indicated understanding, we proceeded with the session.  Also discussed treatment planning, as needed, including ongoing verbal agreement with the plan, the opportunity to ask and answer all questions, her demonstrated understanding of instructions, and her readiness to call the office should symptoms worsen or she feels she is in a crisis state and needs more immediate and tangible assistance. The client states that she has had better acceptance with  the chair of her department.  She realizes that her chair says the right things but really does not mean them.  "I have to read through the trash to find the truth." The client has been disappointed that her department has not had the professional connection that she wanted.  It feels to her that people are not serious about the research or their academic interest in archaeology.  She states that there is a Adult nurse professor coming on board.  She thinks this will be a positive asset to the department. The client sees how much of her family of origin issues have played into her beliefs about relationships at jobs.  She expects people to value relationship and others.  What she finds is a lot of self-interest and narcissism.  I explained to the client that especially in the [place] this is the way of the world.  This is not her doing something wrong.  The client is slowly coming around to that idea. The client will continue to journal her feelings out which she states she has found helpful.  Interventions: Assertiveness/Communication, Motivational Interviewing, Solution-Oriented/Positive Psychology and Insight-Oriented  Diagnosis:   ICD-10-CM   1. Adjustment disorder with mixed anxiety and depressed mood  F43.23     Plan: Assertiveness, boundaries, self-care, positive self talk, journaling.  Kristell Wooding, [facility]

## 2020-01-20 ENCOUNTER — Other Ambulatory Visit (HOSPITAL_COMMUNITY)
Admission: RE | Admit: 2020-01-20 | Discharge: 2020-01-20 | Disposition: A | Discharge status: Home / Self Care | Payer: BC Managed Care – PPO | Source: Ambulatory Visit | Attending: Family Medicine | Admitting: Family Medicine

## 2020-01-20 ENCOUNTER — Other Ambulatory Visit: Payer: Self-pay | Admitting: Family Medicine

## 2020-01-20 DIAGNOSIS — Z124 Encounter for screening for malignant neoplasm of cervix: Secondary | ICD-10-CM | POA: Insufficient documentation

## 2020-01-24 LAB — CYTOLOGY - PAP: Diagnosis: NEGATIVE

## 2020-03-17 IMAGING — MG DIGITAL SCREENING BILATERAL MAMMOGRAM WITH TOMO AND CAD
8 series · 8 of 24 positions shown · non-contrast
Comparison: Previous exam(s).

CLINICAL DATA: Screening.

EXAM:
DIGITAL SCREENING BILATERAL MAMMOGRAM WITH TOMO AND CAD

[R MLO synth-2D]
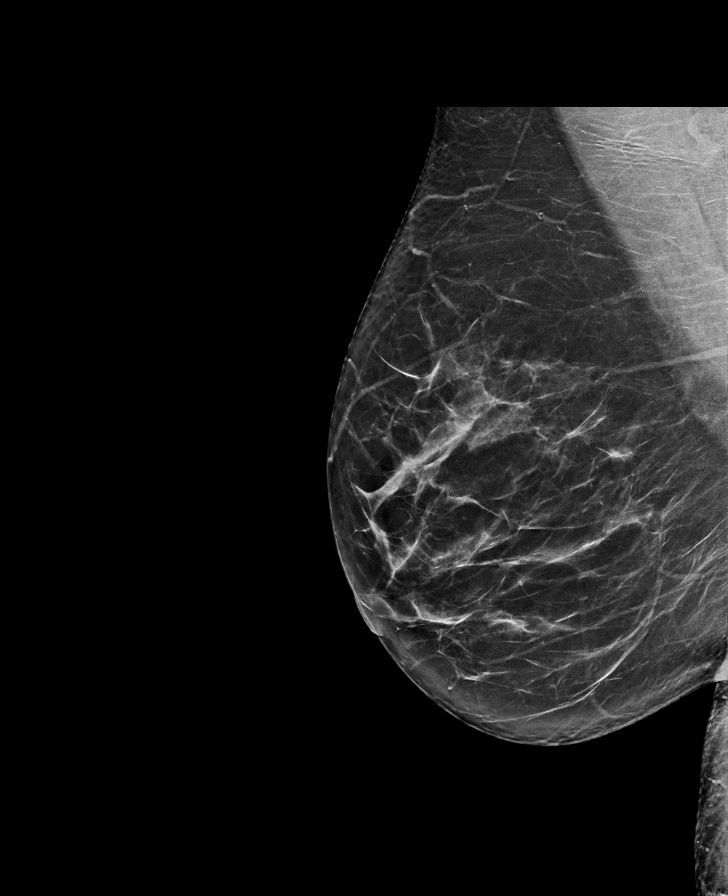

[L MLO synth-2D]
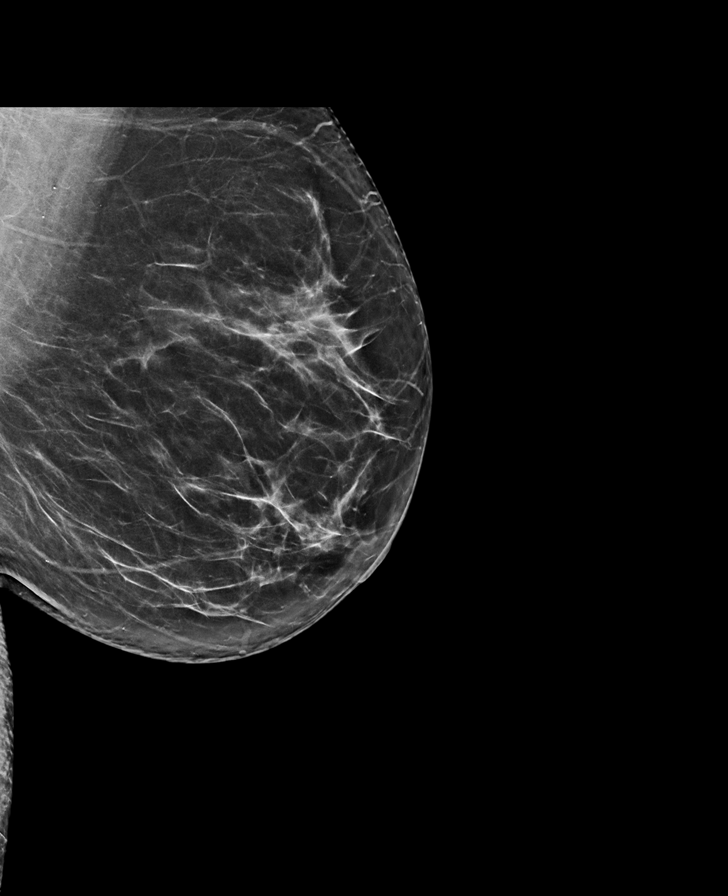

[R CC synth-2D]
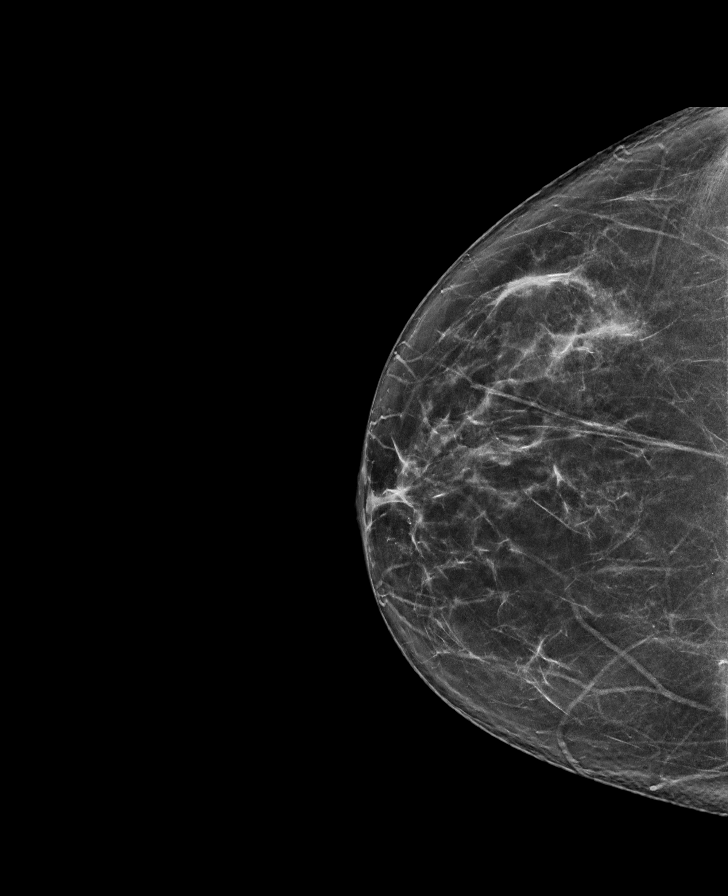

[L CC synth-2D]
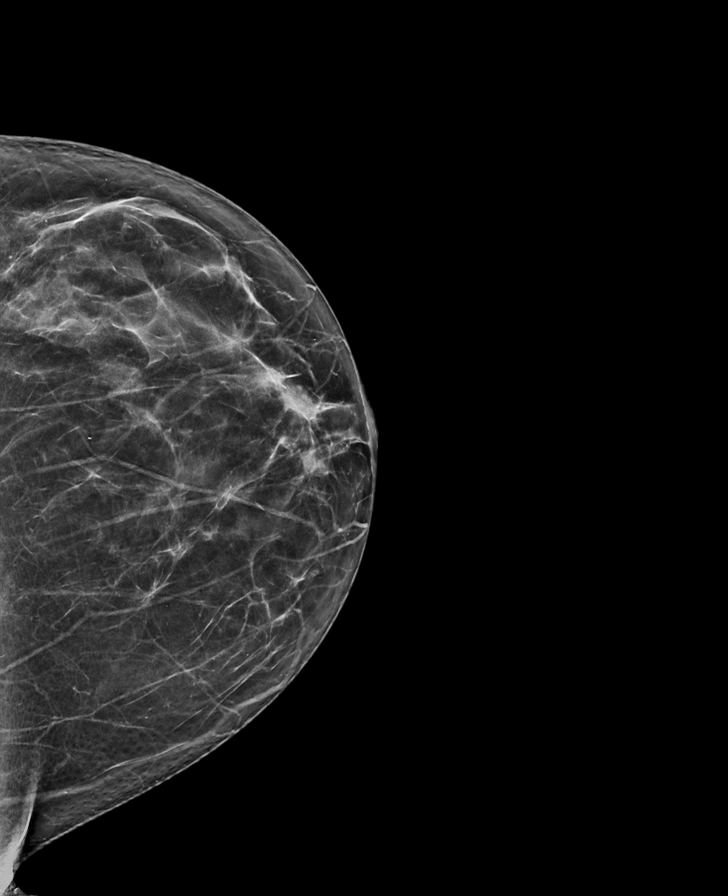

[R CC tomo · tomo slice 39/78.0]
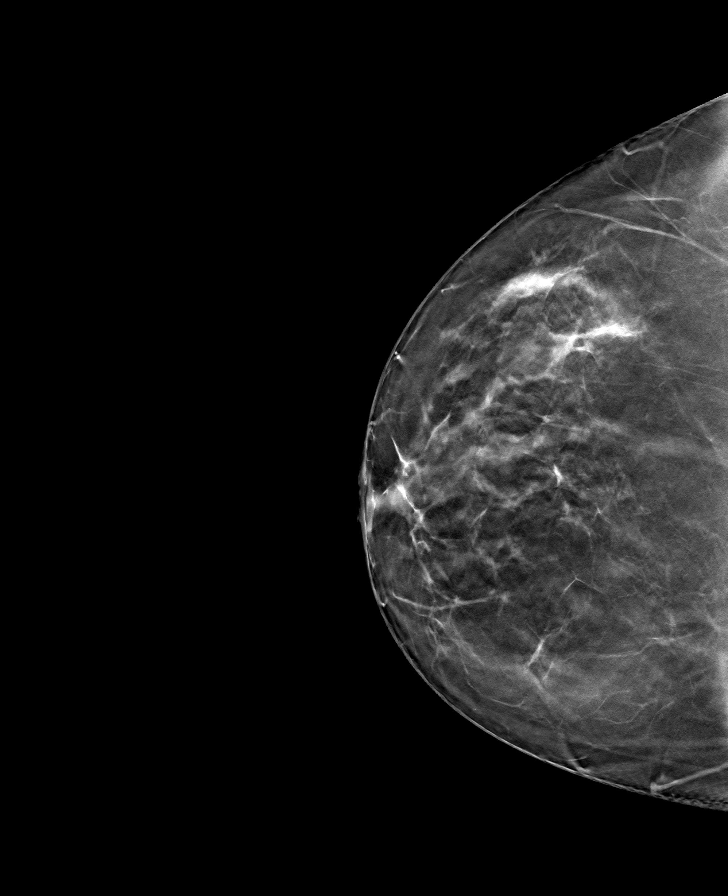

[L MLO tomo · tomo slice 41/82.0]
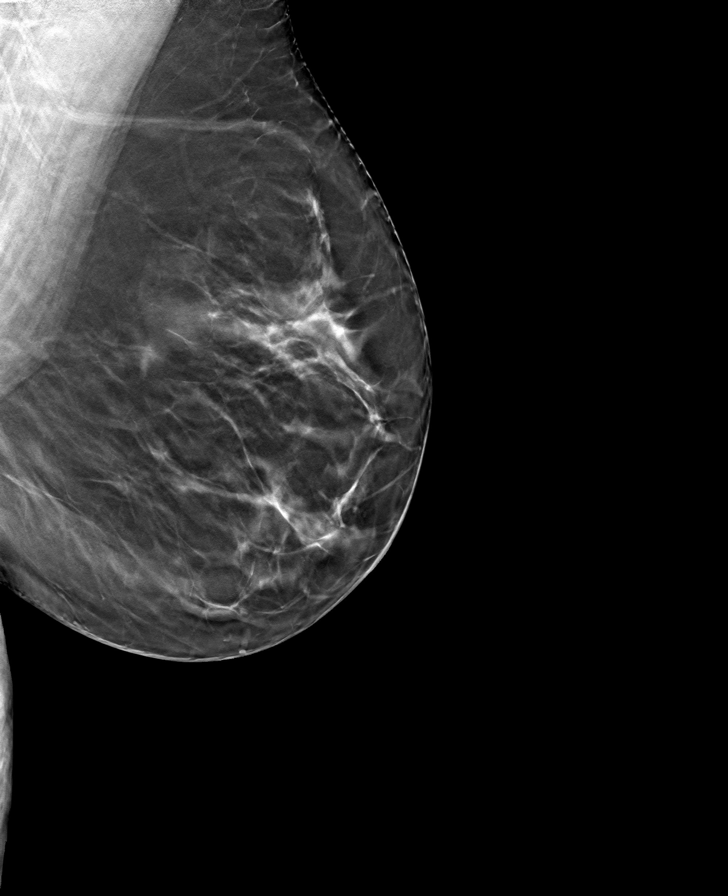

[R MLO tomo · tomo slice 43/85.0]
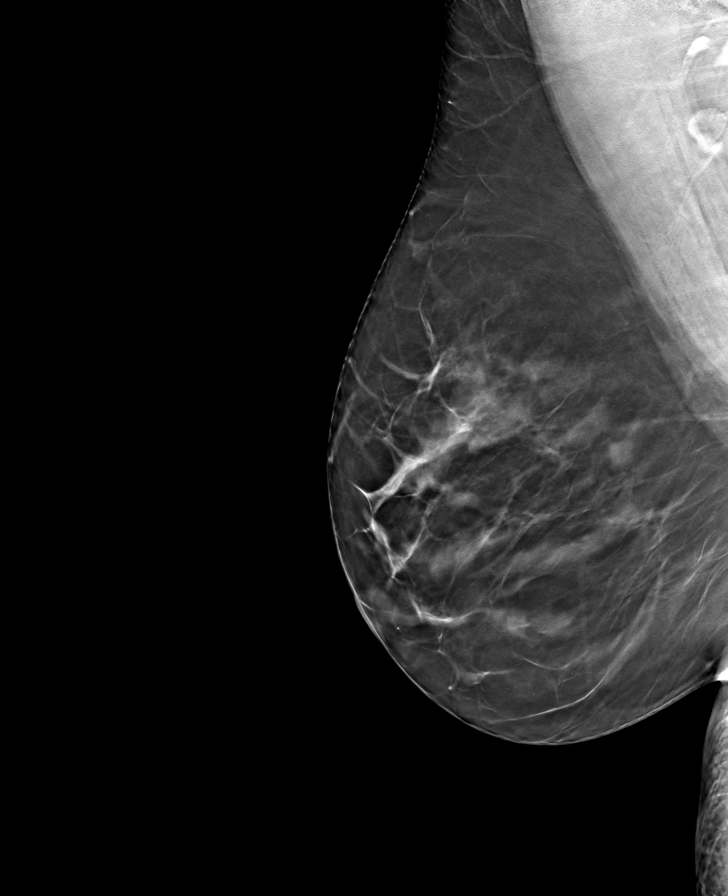

[L CC tomo · tomo slice 35/69.0]
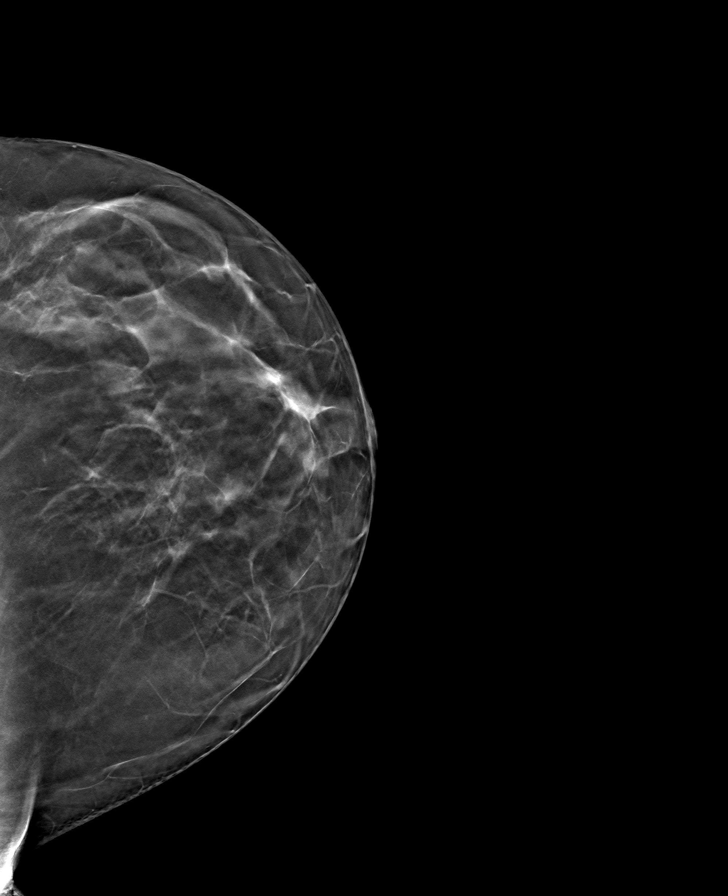

[8 of 24 positions shown; findings below may reference images not displayed]

ACR Breast Density Category b: There are scattered areas of
fibroglandular density.
FINDINGS: There are no findings suspicious for malignancy. Images were
processed with CAD.
IMPRESSION: No mammographic evidence of malignancy. A result letter of this
screening mammogram will be mailed directly to the patient.

RECOMMENDATION:
Screening mammogram in one year. (Code:[id number])

BI-RADS CATEGORY  1: Negative.

## 2020-04-16 ENCOUNTER — Ambulatory Visit: Payer: BC Managed Care – PPO | Attending: Internal Medicine

## 2020-04-16 DIAGNOSIS — Z20822 Contact with and (suspected) exposure to covid-19: Secondary | ICD-10-CM

## 2020-04-17 LAB — NOVEL CORONAVIRUS, NAA: SARS-CoV-2, NAA: NOT DETECTED

## 2020-04-17 LAB — SARS-COV-2, NAA 2 DAY TAT

## 2020-07-10 ENCOUNTER — Other Ambulatory Visit: Payer: Self-pay

## 2020-07-10 DIAGNOSIS — Z1231 Encounter for screening mammogram for malignant neoplasm of breast: Secondary | ICD-10-CM

## 2020-07-13 ENCOUNTER — Ambulatory Visit
Admission: RE | Admit: 2020-07-13 | Discharge: 2020-07-13 | Disposition: A | Discharge status: Home / Self Care | Payer: BC Managed Care – PPO | Source: Ambulatory Visit | Attending: Family Medicine | Admitting: Family Medicine

## 2020-07-13 ENCOUNTER — Other Ambulatory Visit: Payer: Self-pay

## 2020-07-13 DIAGNOSIS — Z1231 Encounter for screening mammogram for malignant neoplasm of breast: Secondary | ICD-10-CM

## 2020-09-28 ENCOUNTER — Other Ambulatory Visit: Payer: BC Managed Care – PPO

## 2020-09-28 DIAGNOSIS — Z20822 Contact with and (suspected) exposure to covid-19: Secondary | ICD-10-CM

## 2020-09-30 LAB — NOVEL CORONAVIRUS, NAA: SARS-CoV-2, NAA: NOT DETECTED

## 2020-09-30 LAB — SARS-COV-2, NAA 2 DAY TAT

## 2021-07-23 ENCOUNTER — Other Ambulatory Visit: Payer: Self-pay | Admitting: Family Medicine

## 2021-07-23 LAB — SARS CORONAVIRUS 2 (TAT 6-24 HRS): SARS Coronavirus 2: NEGATIVE

## 2021-12-27 ENCOUNTER — Other Ambulatory Visit: Payer: Self-pay | Admitting: Family Medicine

## 2021-12-27 DIAGNOSIS — Z1231 Encounter for screening mammogram for malignant neoplasm of breast: Secondary | ICD-10-CM

## 2022-01-07 ENCOUNTER — Other Ambulatory Visit: Payer: Self-pay

## 2022-01-07 ENCOUNTER — Ambulatory Visit
Admission: RE | Admit: 2022-01-07 | Discharge: 2022-01-07 | Disposition: A | Discharge status: Home / Self Care | Payer: BC Managed Care – PPO | Source: Ambulatory Visit | Attending: Family Medicine | Admitting: Family Medicine

## 2022-01-07 DIAGNOSIS — Z1231 Encounter for screening mammogram for malignant neoplasm of breast: Secondary | ICD-10-CM

## 2024-06-21 ENCOUNTER — Other Ambulatory Visit: Payer: Self-pay | Admitting: Internal Medicine

## 2024-06-21 ENCOUNTER — Ambulatory Visit
Admission: RE | Admit: 2024-06-21 | Discharge: 2024-06-21 | Disposition: A | Discharge status: Home / Self Care | Source: Ambulatory Visit | Attending: Internal Medicine | Admitting: Internal Medicine

## 2024-06-21 DIAGNOSIS — Z1231 Encounter for screening mammogram for malignant neoplasm of breast: Secondary | ICD-10-CM

## 2024-11-23 ENCOUNTER — Emergency Department (HOSPITAL_COMMUNITY): Payer: Self-pay

## 2024-11-23 ENCOUNTER — Encounter (HOSPITAL_COMMUNITY): Payer: Self-pay

## 2024-11-23 ENCOUNTER — Observation Stay (HOSPITAL_COMMUNITY)
Admission: EM | Admit: 2024-11-23 | Discharge: 2024-11-25 | Disposition: A | Discharge status: Home / Self Care | Payer: Self-pay | Attending: General Surgery | Admitting: General Surgery

## 2024-11-23 ENCOUNTER — Other Ambulatory Visit: Payer: Self-pay

## 2024-11-23 DIAGNOSIS — S37812A Contusion of adrenal gland, initial encounter: Principal | ICD-10-CM | POA: Insufficient documentation

## 2024-11-23 DIAGNOSIS — S0003XA Contusion of scalp, initial encounter: Secondary | ICD-10-CM | POA: Insufficient documentation

## 2024-11-23 DIAGNOSIS — E871 Hypo-osmolality and hyponatremia: Secondary | ICD-10-CM | POA: Diagnosis not present

## 2024-11-23 DIAGNOSIS — E2749 Other adrenocortical insufficiency: Secondary | ICD-10-CM | POA: Diagnosis present

## 2024-11-23 DIAGNOSIS — R079 Chest pain, unspecified: Secondary | ICD-10-CM | POA: Diagnosis present

## 2024-11-23 DIAGNOSIS — T1490XA Injury, unspecified, initial encounter: Secondary | ICD-10-CM

## 2024-11-23 DIAGNOSIS — S060X0A Concussion without loss of consciousness, initial encounter: Secondary | ICD-10-CM

## 2024-11-23 LAB — CBC
HCT: 41 % (ref 36.0–46.0)
Hemoglobin: 13.9 g/dL (ref 12.0–15.0)
MCH: 29.8 pg (ref 26.0–34.0)
MCHC: 33.9 g/dL (ref 30.0–36.0)
MCV: 87.8 fL (ref 80.0–100.0)
Platelets: 293 10*3/uL (ref 150–400)
RBC: 4.67 MIL/uL (ref 3.87–5.11)
RDW: 13 % (ref 11.5–15.5)
WBC: 8.3 10*3/uL (ref 4.0–10.5)
nRBC: 0 % (ref 0.0–0.2)

## 2024-11-23 LAB — COMPREHENSIVE METABOLIC PANEL WITH GFR
ALT: 40 U/L (ref 0–44)
AST: 52 U/L — ABNORMAL HIGH (ref 15–41)
Albumin: 4.2 g/dL (ref 3.5–5.0)
Alkaline Phosphatase: 69 U/L (ref 38–126)
Anion gap: 12 (ref 5–15)
BUN: 11 mg/dL (ref 6–20)
CO2: 22 mmol/L (ref 22–32)
Calcium: 9.5 mg/dL (ref 8.9–10.3)
Chloride: 103 mmol/L (ref 98–111)
Creatinine, Ser: 0.69 mg/dL (ref 0.44–1.00)
GFR, Estimated: >60 mL/min
Glucose, Bld: 121 mg/dL — ABNORMAL HIGH (ref 70–99)
Potassium: 4.2 mmol/L (ref 3.5–5.1)
Sodium: 136 mmol/L (ref 135–145)
Total Bilirubin: 0.6 mg/dL (ref 0.0–1.2)
Total Protein: 7.1 g/dL (ref 6.5–8.1)

## 2024-11-23 LAB — URINALYSIS, ROUTINE W REFLEX MICROSCOPIC
Bacteria, UA: NONE SEEN
Bilirubin Urine: NEGATIVE
Glucose, UA: NEGATIVE mg/dL
Ketones, ur: 20 mg/dL — AB
Leukocytes,Ua: NEGATIVE
Nitrite: NEGATIVE
Protein, ur: NEGATIVE mg/dL
Specific Gravity, Urine: >1.06 — ABNORMAL HIGH (ref 1.005–1.030)
pH: 7 (ref 5.0–8.0)

## 2024-11-23 LAB — I-STAT CHEM 8, ED
BUN: 11 mg/dL (ref 6–20)
Calcium, Ion: 1.16 mmol/L (ref 1.15–1.40)
Chloride: 102 mmol/L (ref 98–111)
Creatinine, Ser: 0.7 mg/dL (ref 0.44–1.00)
Glucose, Bld: 120 mg/dL — ABNORMAL HIGH (ref 70–99)
HCT: 43 % (ref 36.0–46.0)
Hemoglobin: 14.6 g/dL (ref 12.0–15.0)
Potassium: 3.9 mmol/L (ref 3.5–5.1)
Sodium: 139 mmol/L (ref 135–145)
TCO2: 23 mmol/L (ref 22–32)

## 2024-11-23 LAB — TYPE AND SCREEN
ABO/RH(D): B POS
Antibody Screen: NEGATIVE

## 2024-11-23 LAB — I-STAT CG4 LACTIC ACID, ED: Lactic Acid, Venous: 1.8 mmol/L (ref 0.5–1.9)

## 2024-11-23 LAB — PROTIME-INR
INR: 1 (ref 0.8–1.2)
Prothrombin Time: 13.8 s (ref 11.4–15.2)

## 2024-11-23 LAB — ETHANOL: Alcohol, Ethyl (B): <15 mg/dL

## 2024-11-23 LAB — ABO/RH: ABO/RH(D): B POS

## 2024-11-23 LAB — CK: Total CK: 67 U/L (ref 38–234)

## 2024-11-23 MED ORDER — METHOCARBAMOL 500 MG PO TABS
1000.0000 mg | ORAL_TABLET | Freq: Four times a day (QID) | ORAL | Status: DC
  Administered 2024-11-23 – 2024-11-25 (×8): 1000 mg via ORAL
  Filled 2024-11-23 (×8): qty 2

## 2024-11-23 MED ORDER — MORPHINE SULFATE (PF) 4 MG/ML IV SOLN
4.0000 mg | Freq: Once | INTRAVENOUS | Status: AC
  Administered 2024-11-23: 4 mg via INTRAVENOUS
  Filled 2024-11-23: qty 1

## 2024-11-23 MED ORDER — POLYETHYLENE GLYCOL 3350 17 G PO PACK: 17.0000 g | PACK | Freq: Every day | ORAL | Status: DC | PRN

## 2024-11-23 MED ORDER — IOHEXOL 350 MG/ML SOLN
75.0000 mL | Freq: Once | INTRAVENOUS | Status: AC | PRN
  Administered 2024-11-23: 75 mL via INTRAVENOUS

## 2024-11-23 MED ORDER — MORPHINE SULFATE (PF) 2 MG/ML IV SOLN: 2.0000 mg | INTRAVENOUS | Status: DC | PRN

## 2024-11-23 MED ORDER — ENOXAPARIN SODIUM 30 MG/0.3ML IJ SOSY
30.0000 mg | PREFILLED_SYRINGE | Freq: Two times a day (BID) | INTRAMUSCULAR | Status: DC
  Administered 2024-11-24 – 2024-11-25 (×3): 30 mg via SUBCUTANEOUS
  Filled 2024-11-23 (×4): qty 0.3

## 2024-11-23 MED ORDER — METHOCARBAMOL 500 MG PO TABS
1000.0000 mg | ORAL_TABLET | Freq: Three times a day (TID) | ORAL | Status: DC
  Administered 2024-11-23: 1000 mg via ORAL
  Filled 2024-11-23: qty 2

## 2024-11-23 MED ORDER — SENNA 8.6 MG PO TABS
1.0000 | ORAL_TABLET | Freq: Two times a day (BID) | ORAL | Status: DC
  Administered 2024-11-23 – 2024-11-25 (×4): 8.6 mg via ORAL
  Filled 2024-11-23 (×4): qty 1

## 2024-11-23 MED ORDER — OXYCODONE-ACETAMINOPHEN 5-325 MG PO TABS
1.0000 | ORAL_TABLET | Freq: Once | ORAL | Status: AC
  Administered 2024-11-23: 1 via ORAL
  Filled 2024-11-23: qty 1

## 2024-11-23 MED ORDER — METHOCARBAMOL 1000 MG/10ML IJ SOLN: 500.0000 mg | Freq: Four times a day (QID) | INTRAMUSCULAR | Status: DC

## 2024-11-23 MED ORDER — LIDOCAINE 5 % EX PTCH
2.0000 | MEDICATED_PATCH | CUTANEOUS | Status: DC
  Administered 2024-11-23: 2 via TRANSDERMAL
  Filled 2024-11-23 (×2): qty 2

## 2024-11-23 MED ORDER — OXYCODONE HCL 5 MG PO TABS
10.0000 mg | ORAL_TABLET | Freq: Once | ORAL | Status: AC
  Administered 2024-11-23: 10 mg via ORAL
  Filled 2024-11-23: qty 2

## 2024-11-23 MED ORDER — IBUPROFEN 400 MG PO TABS
600.0000 mg | ORAL_TABLET | Freq: Three times a day (TID) | ORAL | Status: DC
  Administered 2024-11-23 – 2024-11-25 (×5): 600 mg via ORAL
  Filled 2024-11-23 (×9): qty 1

## 2024-11-23 MED ORDER — ONDANSETRON HCL 4 MG/2ML IJ SOLN
4.0000 mg | Freq: Four times a day (QID) | INTRAMUSCULAR | Status: DC | PRN
  Administered 2024-11-23 – 2024-11-24 (×3): 4 mg via INTRAVENOUS
  Filled 2024-11-23 (×3): qty 2

## 2024-11-23 MED ORDER — SODIUM CHLORIDE 0.9 % IV BOLUS
1000.0000 mL | Freq: Once | INTRAVENOUS | Status: AC
  Administered 2024-11-23: 1000 mL via INTRAVENOUS

## 2024-11-23 MED ORDER — KETOROLAC TROMETHAMINE 30 MG/ML IJ SOLN
30.0000 mg | Freq: Four times a day (QID) | INTRAMUSCULAR | Status: DC
  Administered 2024-11-23: 30 mg via INTRAVENOUS
  Filled 2024-11-23: qty 1

## 2024-11-23 MED ORDER — ACETAMINOPHEN 500 MG PO TABS
1000.0000 mg | ORAL_TABLET | Freq: Four times a day (QID) | ORAL | Status: DC
  Administered 2024-11-23 – 2024-11-24 (×4): 1000 mg via ORAL
  Filled 2024-11-23 (×4): qty 2

## 2024-11-23 MED ORDER — ONDANSETRON 4 MG PO TBDP: 4.0000 mg | ORAL_TABLET | Freq: Four times a day (QID) | ORAL | Status: DC | PRN

## 2024-11-23 MED ORDER — OXYCODONE HCL 5 MG PO TABS
5.0000 mg | ORAL_TABLET | ORAL | Status: DC | PRN
  Administered 2024-11-23 – 2024-11-24 (×3): 10 mg via ORAL
  Filled 2024-11-23 (×3): qty 2

## 2024-11-23 MED ORDER — METOPROLOL TARTRATE 5 MG/5ML IV SOLN: 5.0000 mg | Freq: Four times a day (QID) | INTRAVENOUS | Status: DC | PRN

## 2024-11-23 MED ORDER — HYDRALAZINE HCL 20 MG/ML IJ SOLN: 10.0000 mg | INTRAMUSCULAR | Status: DC | PRN

## 2024-11-23 NOTE — TOC CAGE-AID Note (Signed)
 Transition of Care [facility]) - CAGE-AID Screening  Patient Details  Name: Tammy [name] MRN: [id number] Date of Birth: Aug 25, 1971  Clinical Narrative:  Patient endorses occasional alcohol use, denies any drug use. Patient does not need substance abuse resources at this time.  CAGE-AID Screening:   Have You Ever Felt You Ought to Cut Down on Your Drinking or Drug Use?: No Have People Annoyed You By Critizing Your Drinking Or Drug Use?: No Have You Felt Bad Or Guilty About Your Drinking Or Drug Use?: No Have You Ever Had a Drink or Used Drugs First Thing In The Morning to Steady Your Nerves or to Get Rid of a Hangover?: No CAGE-AID Score: 0  Substance Abuse Education Offered: No
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
## 2024-11-23 NOTE — ED Notes (Signed)
 Pt is starting to eat her food and wants me to wait to give percocet until she eats.
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
## 2024-11-24 LAB — BASIC METABOLIC PANEL WITH GFR
Anion gap: 9 (ref 5–15)
Anion gap: 11 (ref 5–15)
BUN: 7 mg/dL (ref 6–20)
BUN: 6 mg/dL (ref 6–20)
CO2: 22 mmol/L (ref 22–32)
CO2: 23 mmol/L (ref 22–32)
Calcium: 9.1 mg/dL (ref 8.9–10.3)
Calcium: 9 mg/dL (ref 8.9–10.3)
Chloride: 97 mmol/L — ABNORMAL LOW (ref 98–111)
Chloride: 95 mmol/L — ABNORMAL LOW (ref 98–111)
Creatinine, Ser: 0.56 mg/dL (ref 0.44–1.00)
Creatinine, Ser: 0.52 mg/dL (ref 0.44–1.00)
GFR, Estimated: >60 mL/min
GFR, Estimated: >60 mL/min
Glucose, Bld: 144 mg/dL — ABNORMAL HIGH (ref 70–99)
Glucose, Bld: 119 mg/dL — ABNORMAL HIGH (ref 70–99)
Potassium: 4.2 mmol/L (ref 3.5–5.1)
Potassium: 4.2 mmol/L (ref 3.5–5.1)
Sodium: 129 mmol/L — ABNORMAL LOW (ref 135–145)
Sodium: 129 mmol/L — ABNORMAL LOW (ref 135–145)

## 2024-11-24 LAB — CBC
HCT: 33.9 % — ABNORMAL LOW (ref 36.0–46.0)
Hemoglobin: 11.9 g/dL — ABNORMAL LOW (ref 12.0–15.0)
MCH: 30 pg (ref 26.0–34.0)
MCHC: 35.1 g/dL (ref 30.0–36.0)
MCV: 85.4 fL (ref 80.0–100.0)
Platelets: 257 10*3/uL (ref 150–400)
RBC: 3.97 MIL/uL (ref 3.87–5.11)
RDW: 12.8 % (ref 11.5–15.5)
WBC: 12.8 10*3/uL — ABNORMAL HIGH (ref 4.0–10.5)
nRBC: 0 % (ref 0.0–0.2)

## 2024-11-24 MED ORDER — PROGESTERONE MICRONIZED 100 MG PO CAPS
300.0000 mg | ORAL_CAPSULE | Freq: Every evening | ORAL | Status: DC
  Administered 2024-11-24: 300 mg via ORAL
  Filled 2024-11-24 (×2): qty 1

## 2024-11-24 MED ORDER — ACETAMINOPHEN 325 MG PO TABS: 325.0000 mg | ORAL_TABLET | Freq: Four times a day (QID) | ORAL | Status: DC | PRN

## 2024-11-24 MED ORDER — MELATONIN 3 MG PO TABS
3.0000 mg | ORAL_TABLET | Freq: Every day | ORAL | Status: DC
  Administered 2024-11-24: 3 mg via ORAL
  Filled 2024-11-24: qty 1

## 2024-11-24 MED ORDER — LIDOCAINE 5 % EX PTCH
3.0000 | MEDICATED_PATCH | CUTANEOUS | Status: DC
  Administered 2024-11-24: 3 via TRANSDERMAL
  Filled 2024-11-24 (×2): qty 3

## 2024-11-24 MED ORDER — OXYCODONE-ACETAMINOPHEN 5-325 MG PO TABS
1.0000 | ORAL_TABLET | ORAL | Status: DC | PRN
  Administered 2024-11-24 – 2024-11-25 (×7): 2 via ORAL
  Filled 2024-11-24 (×7): qty 2

## 2024-11-24 MED ORDER — MECLIZINE HCL 12.5 MG PO TABS
12.5000 mg | ORAL_TABLET | Freq: Three times a day (TID) | ORAL | Status: DC | PRN
  Filled 2024-11-24: qty 1

## 2024-11-24 MED ORDER — PROMETHAZINE HCL 6.25 MG/5ML PO SOLN
25.0000 mg | Freq: Four times a day (QID) | ORAL | Status: DC | PRN
  Administered 2024-11-24: 25 mg via ORAL
  Filled 2024-11-24 (×2): qty 20
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
## 2024-11-25 ENCOUNTER — Other Ambulatory Visit (HOSPITAL_COMMUNITY): Payer: Self-pay

## 2024-11-25 LAB — CBC
HCT: 35.4 % — ABNORMAL LOW (ref 36.0–46.0)
Hemoglobin: 12.2 g/dL (ref 12.0–15.0)
MCH: 30 pg (ref 26.0–34.0)
MCHC: 34.5 g/dL (ref 30.0–36.0)
MCV: 87.2 fL (ref 80.0–100.0)
Platelets: 269 10*3/uL (ref 150–400)
RBC: 4.06 MIL/uL (ref 3.87–5.11)
RDW: 12.9 % (ref 11.5–15.5)
WBC: 10.2 10*3/uL (ref 4.0–10.5)
nRBC: 0 % (ref 0.0–0.2)

## 2024-11-25 LAB — MISC LABCORP TEST (SEND OUT): Labcorp test code: 83935

## 2024-11-25 LAB — BASIC METABOLIC PANEL WITH GFR
Anion gap: 9 (ref 5–15)
BUN: 7 mg/dL (ref 6–20)
CO2: 25 mmol/L (ref 22–32)
Calcium: 9.6 mg/dL (ref 8.9–10.3)
Chloride: 100 mmol/L (ref 98–111)
Creatinine, Ser: 0.72 mg/dL (ref 0.44–1.00)
GFR, Estimated: >60 mL/min
Glucose, Bld: 120 mg/dL — ABNORMAL HIGH (ref 70–99)
Potassium: 3.9 mmol/L (ref 3.5–5.1)
Sodium: 133 mmol/L — ABNORMAL LOW (ref 135–145)

## 2024-11-25 MED ORDER — ACETAMINOPHEN 325 MG PO TABS: 325.0000 mg | ORAL_TABLET | Freq: Four times a day (QID) | ORAL | Status: AC | PRN

## 2024-11-25 MED ORDER — LIDOCAINE 5 % EX PTCH: 3.0000 | MEDICATED_PATCH | CUTANEOUS | Status: AC

## 2024-11-25 MED ORDER — IBUPROFEN 600 MG PO TABS: 600.0000 mg | ORAL_TABLET | Freq: Three times a day (TID) | ORAL | Status: AC | PRN

## 2024-11-25 MED ORDER — LIDOCAINE 5 % EX PTCH
3.0000 | MEDICATED_PATCH | CUTANEOUS | Status: DC
  Administered 2024-11-25: 3 via TRANSDERMAL
  Filled 2024-11-25: qty 3

## 2024-11-25 MED ORDER — METHOCARBAMOL 500 MG PO TABS
1000.0000 mg | ORAL_TABLET | Freq: Four times a day (QID) | ORAL | 0 refills | Status: AC | PRN
  Filled 2024-11-25: qty 60, 8d supply, fill #0

## 2024-11-25 MED ORDER — OXYCODONE-ACETAMINOPHEN 5-325 MG PO TABS
1.0000 | ORAL_TABLET | ORAL | 0 refills | Status: AC | PRN
  Filled 2024-11-25: qty 30, 3d supply, fill #0
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
## 2024-11-25 NOTE — TOC Transition Note (Signed)
 Transition of Care [facility]) - Discharge Note   Patient Details  Name: Tanga Gloor MRN: [id number] Date of Birth: 03-Jul-1971  Transition of Care [facility]) CM/SW Contact:  Zachary Lovins M, RN Phone Number: 11/25/2024, 12:38pm  Clinical Narrative:    Patient medically stable for discharge home today with sister and friends to provide needed assistance.  OT recommending OP follow up; referral to [facility] on [facility] for follow up.  Spoke with Landry Fort, RN, Medical Case Manager for [facility]): She states that 3C floor can provide RW and BSC, and she will have shower seat delivered to patient's home.   Faxed patient clinicals, orders, and referrals to [facility] Case Manager, per her request to [phone].   Return to work letter provided for patient and faxed to her as well.   Contact # for [facility] Case Manager: [phone]   Final next level of care: OP Rehab Barriers to Discharge: Barriers Resolved                       Discharge Plan and Services Additional resources added to the After Visit Summary for     Discharge Planning Services: CM Consult            DME Arranged: Vannie rolling, Bedside commode, Shower stool DME Agency: [employer] Date DME Agency Contacted: 11/25/24 Time DME Agency Contacted: [phone] Representative spoke with at DME Agency: RW, BSC from [employer].  Beth Hagler with WC to order shower seat and have shipped to pt            Social Drivers of Health (SDOH) Interventions SDOH Screenings   Tobacco Use: Low Risk (11/23/2024)     Readmission Risk Interventions     No data to display         Mliss MICAEL Fass, RN, BSN  Trauma/Neuro ICU Case Manager [phone]
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
## 2024-11-25 NOTE — Progress Notes (Signed)
 Patient ID: Tammy [name], female   DOB: 11-21-1970, 54 y.o.   MRN: [id number] Na and Hb improved. A&O R lateral chest wall tenderness without crepitance Minimal R flank tenderness, ABD otherwise benign  I met with [name] and her sister along with [name], the Nursing Director, to outline there care plan including D/C later today. PT to see again. We answered all of their questions and reiterated the pain medication strategy. Meds will be sent by [facility] pharmacy.  Dann Hummer, MD, MPH, FACS Please use [email or web address] to contact on call provider
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
## 2024-11-29 ENCOUNTER — Other Ambulatory Visit: Payer: Self-pay | Admitting: General Surgery

## 2024-12-01 ENCOUNTER — Encounter: Payer: Self-pay | Admitting: Physical Medicine and Rehabilitation

## 2024-12-07 ENCOUNTER — Ambulatory Visit: Admitting: Physical Therapy

## 2024-12-13 ENCOUNTER — Ambulatory Visit: Admitting: Physical Therapy

## 2025-01-02 ENCOUNTER — Encounter: Admitting: Physical Medicine and Rehabilitation
